# Patient Record
Sex: Male | Born: 1975 | Race: White | Hispanic: No | Marital: Single | Smoking: Never smoker
Health system: Southern US, Community
[De-identification: ages and names within clinical notes are randomized; demographics above are authoritative.]

## PROBLEM LIST (undated history)

## (undated) DIAGNOSIS — F32A Depression, unspecified: Secondary | ICD-10-CM

## (undated) DIAGNOSIS — F419 Anxiety disorder, unspecified: Secondary | ICD-10-CM

## (undated) DIAGNOSIS — F329 Major depressive disorder, single episode, unspecified: Secondary | ICD-10-CM

---

## 2007-02-03 ENCOUNTER — Ambulatory Visit: Payer: Self-pay | Admitting: Surgery

## 2007-02-03 ENCOUNTER — Encounter (INDEPENDENT_AMBULATORY_CARE_PROVIDER_SITE_OTHER): Payer: Self-pay | Admitting: Internal Medicine

## 2007-02-03 ENCOUNTER — Inpatient Hospital Stay (HOSPITAL_COMMUNITY): Admission: EM | Admit: 2007-02-03 | Discharge: 2007-02-04 | Payer: Self-pay | Admitting: Emergency Medicine

## 2009-12-21 ENCOUNTER — Emergency Department (HOSPITAL_COMMUNITY): Admission: EM | Admit: 2009-12-21 | Discharge: 2009-12-22 | Payer: Self-pay | Admitting: Emergency Medicine

## 2010-06-21 LAB — URINALYSIS, ROUTINE W REFLEX MICROSCOPIC
Bilirubin Urine: NEGATIVE
Hgb urine dipstick: NEGATIVE
Ketones, ur: NEGATIVE mg/dL
Nitrite: NEGATIVE

## 2010-08-21 NOTE — Discharge Summary (Signed)
NAMESAIVION, Tyler Rose NO.:  [id number]   MEDICAL RECORD NO.:  [id number]          PATIENT TYPE:  INP   LOCATION:  3025                         FACILITY:  [facility]   PHYSICIAN:  Altha Harm, MDDATE OF BIRTH:  09/08/1975   DATE OF ADMISSION:  02/03/2007  DATE OF DISCHARGE:  02/04/2007                               DISCHARGE SUMMARY   DISCHARGE DIAGNOSES:  Home.   FINAL DISCHARGE DIAGNOSES:  1. Isolated left upper extremity weakness, now resolved.  2. Hyperlipidemia.  3. Mild peripheral arterial disease.   DISCHARGE MEDICATIONS:  Aspirin enteric coated 81 mg p.o. daily.   CONSULTATIONS:  Stroke team.   PROCEDURE:  None.   DIAGNOSTIC STUDIES:  1. CT of the head which showed no acute intracranial abnormalities.  2. MRI of the brain which showed no evidence of ischemic changes.  3. MRA of the intracerebral blood vessels which show no stenosis or      blockages.  4. Carotid Duplex ultrasound which showed 40 to 60% left internal      carotid artery stenosis.   CODE STATUS:  Full code.   ALLERGIES:  NO KNOWN DRUG ALLERGIES.   DIETARY RESTRICTIONS:  The patient should be on a low cholesterol diet.   CHIEF COMPLAINT:  Left upper extremity weakness.   HISTORY OF PRESENT ILLNESS:  Please see the H&P dictated by Dr. Waymon Amato  for details of the HPI.   HOSPITAL COURSE:  The patient was on his way [place] [employer] [place]  was just discharged from the [employer] to [place] on the bus where he was  noted by himself to have weakness of the left upper extremity.  The  patient exited the bus and came to the emergency room to be evaluated.  By the time the patient got to the emergency room his symptoms were  essentially resolved.  The stroke team was consulted while he was in the  emergency room; however, no intervention was mediated at that time due  to the fact that the symptoms were quickly resolving.  The patient had  several studies done as noted above and  all studies showed no evidence  of ischemic changes.  During the course of his 24 hours here the  patient's symptoms resolved completely and the patient who is left-  handed is now able to perform all ADLs with his left hand including  handwriting with normal legibility.  Risk factors were assessed and the  patient was found to have mild hyperlipidemia with an LDL of 134 and  total cholesterol of 187.  Interventions were discussed with the patient  and the patient wants to try dietary intervention for at least 6 weeks.   FOLLOWUP:  The patient will pursue followup with a physician in [place]  upon his return there.   Dietary restrictions are as noted above.  Physical restrictions are  none.      Altha Harm, MD  Electronically Signed     MAM/MEDQ  D:  02/04/2007  T:  02/04/2007  Job:  [id number]

## 2010-08-21 NOTE — H&P (Signed)
Tyler Kitchen  Rose, Tyler Rose NO.:  [id number]   MEDICAL RECORD NO.:  [id number]          PATIENT TYPE:  EMS   LOCATION:  MAJO                         FACILITY:  [facility]   PHYSICIAN:  Marcellus Scott, MD     DATE OF BIRTH:  [birth year]   DATE OF ADMISSION:  02/03/2007  DATE OF DISCHARGE:                              HISTORY & PHYSICAL   PRIMARY MEDICAL DOCTOR:  Unassigned.   CHIEF COMPLAINT:  1. Left upper extremity tingling, numbness and weakness.  2. Chest discomfort and palpitations.   HISTORY OF PRESENT ILLNESS:  Mr. Lanese is a pleasant 35 year old  Caucasian male patient with no past medical history.  He was traveling  from [place], [place] to [place], [place] by [employer].  He arrived from  [place], [place] after a 5 hour ride on the [place] bus to  [place] at about 11:15 p.m.  The patient was sitting on a chair at  the [employer] bus station.  At approximately 12:30 a.m., the patient  noticed subacute onset of tingling of his left upper extremity with  associated numbness followed by weakness of the whole extremity.  The  patient is left handed.  He had to lift his left hand with his right  hand.  There was no associated headache or twisting of the mouth or  slurred speech or loss of consciousness.  He indicated some twitching of  his left side of the face but no tingling or numbness.  There was no  symptoms in the left lower extremity.  Following this, he also noted  some chest discomfort/precordial chest pressure with palpitations.  However, the palpitations and chest pressure resolved spontaneously over  the next few minutes, and the left upper extremity tingling and numbness  and weakness improved over the next 10 minutes.  The patient walked out  over the bus station, and a police officer brought him to the emergency  room at [facility].  At the current time, the patient says his  left upper extremity tingling and numbness have  resolved.  His chest  pressure and palpitations have resolved.  His strength has improved by  85% in the left upper extremity.  However, he indicates that he still  has some sensation of weakness in the left upper extremity.  He is being  admitted for further evaluation and management.  The patient has not had  similar episodes in the past. There is no history of holding his left  upper limb in an abnormal position.   PAST MEDICAL HISTORY:  None.   PAST SURGICAL HISTORY:  None.   PAST PSYCHIATRIC HISTORY:  None.   ALLERGIES:  No known drug allergies.   MEDICATIONS:  None.   FAMILY HISTORY:  Noncontributory.   SOCIAL HISTORY:  The patient is single.  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  He has just finished an assignment in the [employer] three weeks  ago.  He denies any history of smoking, alcohol or drug abuse.   REVIEW OF SYSTEMS:  Fourteen systems reviewed and apart from the history  of  present illness is noncontributory.   PHYSICAL EXAMINATION:  GENERAL:  Mr. Mulkern is a moderately built and  nourished young male in no obvious distress.  VITAL SIGNS:  Temperature 98.1, blood pressure 137/76, pulse 74 and  regular, respirations 19 per minute, saturation 99% on room air.  HEENT:  Normocephalic, atraumatic.  Pupils equal, round and reactive to  light and accommodation.  NECK:  Without JVD, carotid bruit, lymphadenopathy or goiter.  Supple.  LYMPHATICS:  No lymphadenopathy.  RESPIRATORY:  Clear to auscultation.  CARDIOVASCULAR:  First and second heart sounds are heard.  No third or  fourth heart sounds.  No murmurs, rubs, gallops or clicks.  ABDOMEN:  Nondistended, nontender.  No organomegaly or mass appreciated.  Bowel sounds are normally heard.  CENTRAL NERVOUS SYSTEM:  The patient is awake, alert, oriented x3 with  no cranial nerve deficits.  EXTREMITIES:  No cyanosis, clubbing or edema.  Peripheral pulses are  symmetrically felt.  Normal tone throughout.  Left upper  extremity  proximally is grade 5/5 power, distally in the fingers and hand 4+ to  5/5 power, symmetrical 2+ reflexes throughout and flexor plantars.  No  sensory deficits.   LABORATORY DATA:  Point of care cardiac markers and D. dimer negative.  CBCs are normal.  Basic metabolic panel on I-stat normal with creatinine  of 1 and BUN of 11.   CT of the head without contrast.  Impression:  No acute intracranial  abnormality.  Chest x-ray, which I have reviewed and discussed with the  radiologist and noted as impression of no active cardiopulmonary  disease.   EKG with normal sinus rhythm at 66 beats per minute with right bundle  branch block pattern.  No other acute changes.   ASSESSMENT/PLAN:  1. Left upper extremity weakness, tingling, numbness in a young fit      male with no past medical history.  Rule out TIA versus CVA.  Will      admit to telemetry for 23 hour observation.  Will obtain an MRI and      MRA of the head without contrast stat.  Will also obtain an      echocardiogram, carotid Dopplers and a stroke panel.  Will place      the patient on aspirin which can be discontinued if workup is      negative.  Will also obtain urine toxicology screen.  2. Chest discomfort and palpitations, unclear etiology.  Will monitor      on telemetry.  Will cycle cardiac enzymes and obtain a TSH and      echocardiogram.  Will continue aspirin.      Marcellus Scott, MD  Electronically Signed     AH/MEDQ  D:  02/03/2007  T:  02/03/2007  Job:  [id number]

## 2011-01-16 LAB — COMPREHENSIVE METABOLIC PANEL
ALT: 23
AST: 21
Albumin: 3.5
GFR calc Af Amer: >60
GFR calc non Af Amer: >60
Sodium: 139
Total Protein: 6.5

## 2011-01-16 LAB — DIFFERENTIAL
Basophils Absolute: 0.1
Eosinophils Relative: 2
Lymphs Abs: 2.6
Monocytes Absolute: 0.8 — ABNORMAL HIGH
Monocytes Relative: 10
Neutrophils Relative %: 54

## 2011-01-16 LAB — I-STAT 8, (EC8 V) (CONVERTED LAB)
Bicarbonate: 25.9 — ABNORMAL HIGH
Chloride: 105
Operator id: 257131
pH, Ven: 7.365 — ABNORMAL HIGH

## 2011-01-16 LAB — RAPID URINE DRUG SCREEN, HOSP PERFORMED
Amphetamines: NOT DETECTED
Barbiturates: NOT DETECTED
Benzodiazepines: NOT DETECTED
Cocaine: NOT DETECTED
Tetrahydrocannabinol: NOT DETECTED

## 2011-01-16 LAB — LIPID PANEL
HDL: 35 — ABNORMAL LOW
LDL Cholesterol: 134 — ABNORMAL HIGH
Triglycerides: 88

## 2011-01-16 LAB — POCT CARDIAC MARKERS
Operator id: 257131
Troponin i, poc: <0.05

## 2011-01-16 LAB — CBC
MCHC: 34.8
Platelets: 310

## 2011-01-16 LAB — HOMOCYSTEINE: Homocysteine: 8.6

## 2011-01-16 LAB — TROPONIN I: Troponin I: 0.03

## 2011-01-16 LAB — PROTIME-INR
INR: 0.9
Prothrombin Time: 12.7

## 2011-01-16 LAB — CK TOTAL AND CKMB (NOT AT ARMC): Total CK: 240 — ABNORMAL HIGH

## 2016-07-18 ENCOUNTER — Emergency Department (HOSPITAL_COMMUNITY)
Admission: EM | Admit: 2016-07-18 | Discharge: 2016-07-19 | Disposition: A | Discharge status: Home / Self Care | Payer: Self-pay | Attending: Emergency Medicine | Admitting: Emergency Medicine

## 2016-07-18 ENCOUNTER — Encounter (HOSPITAL_COMMUNITY): Payer: Self-pay | Admitting: Vascular Surgery

## 2016-07-18 ENCOUNTER — Emergency Department (HOSPITAL_COMMUNITY): Payer: Self-pay

## 2016-07-18 DIAGNOSIS — Y9289 Other specified places as the place of occurrence of the external cause: Secondary | ICD-10-CM | POA: Insufficient documentation

## 2016-07-18 DIAGNOSIS — Y939 Activity, unspecified: Secondary | ICD-10-CM | POA: Insufficient documentation

## 2016-07-18 DIAGNOSIS — S93402A Sprain of unspecified ligament of left ankle, initial encounter: Secondary | ICD-10-CM | POA: Insufficient documentation

## 2016-07-18 DIAGNOSIS — Y999 Unspecified external cause status: Secondary | ICD-10-CM | POA: Insufficient documentation

## 2016-07-18 HISTORY — DX: Anxiety disorder, unspecified: F41.9

## 2016-07-18 HISTORY — DX: Depression, unspecified: F32.A

## 2016-07-18 HISTORY — DX: Major depressive disorder, single episode, unspecified: F32.9

## 2016-07-18 MED ORDER — DICLOFENAC SODIUM 50 MG PO TBEC: 50.0000 mg | DELAYED_RELEASE_TABLET | Freq: Two times a day (BID) | ORAL | 0 refills | Status: AC

## 2016-07-18 NOTE — ED Provider Notes (Signed)
[facility]EMERGENCY DEPT Provider Note   CSN: [id number] Arrival date & time: 07/18/16  2155   By signing my name below, I, Clarisse Gouge, attest that this documentation has been prepared under the direction and in the presence of [facility], FNP. Electronically Signed: Clarisse Gouge, Scribe. 07/18/16. 11:19 PM.   History   Chief Complaint Chief Complaint  Patient presents with  . Ankle Pain   The history is provided by the patient and medical records. No language interpreter was used.    Tyler Rose is a 41 y.o. male with no pertinent PMHx, transported via private vehicle to the Emergency Department with concern for gradually worsening R ankle pain s/p sustaining an injury to it ~2 hours prior to evaluation. He states the ankle got jammed between the ramp and platform of an [employer] train as he was exiting it. Moderate swelling noted to the L ankle. Pt describes 7/10, constant, throbbing, R ankle pain that is improved with rest and tylenol; worsened with weight bearing, contact and movement. Pt with mild painful ambulation. Pt denies fall, head trauma, LOC, Hx of DM, HTN or any other complaints at this time.  Past Medical History:  Diagnosis Date  . Anxiety   . Depression     There are no active problems to display for this patient.   History reviewed. No pertinent surgical history.     Home Medications    Prior to Admission medications   Medication Sig Start Date End Date Taking? Authorizing Provider  diclofenac (VOLTAREN) 50 MG EC tablet Take 1 tablet (50 mg total) by mouth 2 (two) times daily. 07/18/16   Daevon Holdren Orlene Och, NP    Family History No family history on file.  Social History Social History  Substance Use Topics  . Smoking status: Never Smoker  . Smokeless tobacco: Never Used  . Alcohol use No     Allergies   Patient has no allergy information on record.   Review of Systems Review of Systems  Constitutional: Negative for activity change.    Respiratory: Negative for shortness of breath.   Gastrointestinal: Negative for nausea.  Musculoskeletal: Positive for arthralgias and joint swelling.       Ankle pain  Skin: Negative for color change and wound.  Neurological: Negative for weakness and numbness.     Physical Exam Updated Vital Signs BP 132/84 (BP Location: Left Arm)   Pulse (!) 112   Temp 97.9 F (36.6 C) (Oral)   Resp 18   SpO2 96%   Physical Exam  Constitutional: He is oriented to person, place, and time. He appears well-developed and well-nourished.  HENT:  Head: Normocephalic and atraumatic.  Eyes: EOM are normal.  Neck: Neck supple.  Cardiovascular: Tachycardia present.   Pulmonary/Chest: Effort normal. No stridor.  Musculoskeletal: He exhibits tenderness. He exhibits no deformity.       Left ankle: He exhibits swelling. He exhibits no ecchymosis, no deformity, no laceration and normal pulse. Decreased range of motion: due to pain. Tenderness. Medial malleolus tenderness found. Achilles tendon normal.  Pedal pulses 2+, adequate circulation, no tenderness over the achilles, no defect, tenderness noted to the lateral aspect of the L ankle  Neurological: He is alert and oriented to person, place, and time. Coordination normal.  Skin: Skin is warm and dry.  Psychiatric: He has a normal mood and affect. His behavior is normal.  Nursing note and vitals reviewed.    ED Treatments / Results  DIAGNOSTIC STUDIES: Oxygen Saturation is 96% on  RA, NL by my interpretation.    COORDINATION OF CARE: 11:14 PM Discussed treatment plan with pt at bedside and pt agreed to plan. Will place in ankle stirrup brace, crutches, order medications and refer to orthopedic specialist.  Labs (all labs ordered are listed, but only abnormal results are displayed) Labs Reviewed - No data to display   Radiology Dg Ankle Complete Left  Result Date: 07/18/2016 CLINICAL DATA:  Left ankle pain after injury EXAM: LEFT ANKLE COMPLETE  - 3+ VIEW COMPARISON:  None. FINDINGS: No fracture or dislocation of the left ankle. There is mild circumferential soft tissue swelling. The visualized hindfoot is unremarkable. There is a small plantar calcaneal enthesophyte. No ankle effusion. IMPRESSION: No fracture or dislocation of the left ankle. Electronically Signed   By: Deatra Robinson M.D.   On: 07/18/2016 22:46    Procedures Procedures (including critical care time) Air cast applied by ortho tec. No focal neuro deficits.   Medications Ordered in ED Medications - No data to display   Initial Impression / Assessment and Plan / ED Course  I have reviewed the triage vital signs and the nursing notes.  Pertinent imaging results that were available during my care of the patient were reviewed by me and considered in my medical decision making (see chart for details).   Final Clinical Impressions(s) / ED Diagnoses  41 y.o. male with left ankle pain s/p injury stable for d/c without focal neur deficits. Placed in air cast, crutches, ice, elevation and f/u with ortho if symptoms persist. X-ray negative for fracture or dislocation. Will treat for pain and inflammation.   Final diagnoses:  Sprain of left ankle, unspecified ligament, initial encounter    New Prescriptions New Prescriptions   DICLOFENAC (VOLTAREN) 50 MG EC TABLET    Take 1 tablet (50 mg total) by mouth 2 (two) times daily.  I personally performed the services described in this documentation, which was scribed in my presence. The recorded information has been reviewed and is accurate.    [place], [place] 07/18/16 2344    Arby Barrette, MD 07/22/16 1055

## 2016-07-18 NOTE — ED Notes (Signed)
Pt stable, understands discharge instructions, and reasons for return.   

## 2016-07-18 NOTE — ED Triage Notes (Signed)
Pt reports to the ED for eval of left ankle pain following an injury that occurred just PTA. He states that he got off the [employer] train and his left ankle got stuck in between the ramp and platform. Denies any fall, head injury, or LOC. He is able to bear weight but states that it is painful.

## 2016-07-19 NOTE — Progress Notes (Signed)
Orthopedic Tech Progress Note Patient Details:  Tyler Rose Dec 06, 1975 [id number]  Ortho Devices Type of Ortho Device: Ankle Air splint, Crutches Ortho Device/Splint Location: lle Ortho Device/Splint Interventions: Ordered, Application   Trinna Post 07/19/2016, 12:41 AM

## 2018-08-29 IMAGING — CR DG ANKLE COMPLETE 3+V*L*
3 series · 3 of 3 positions shown · non-contrast
Comparison: None.

CLINICAL DATA: Left ankle pain after injury

EXAM:
LEFT ANKLE COMPLETE - 3+ VIEW

[ankle ap]
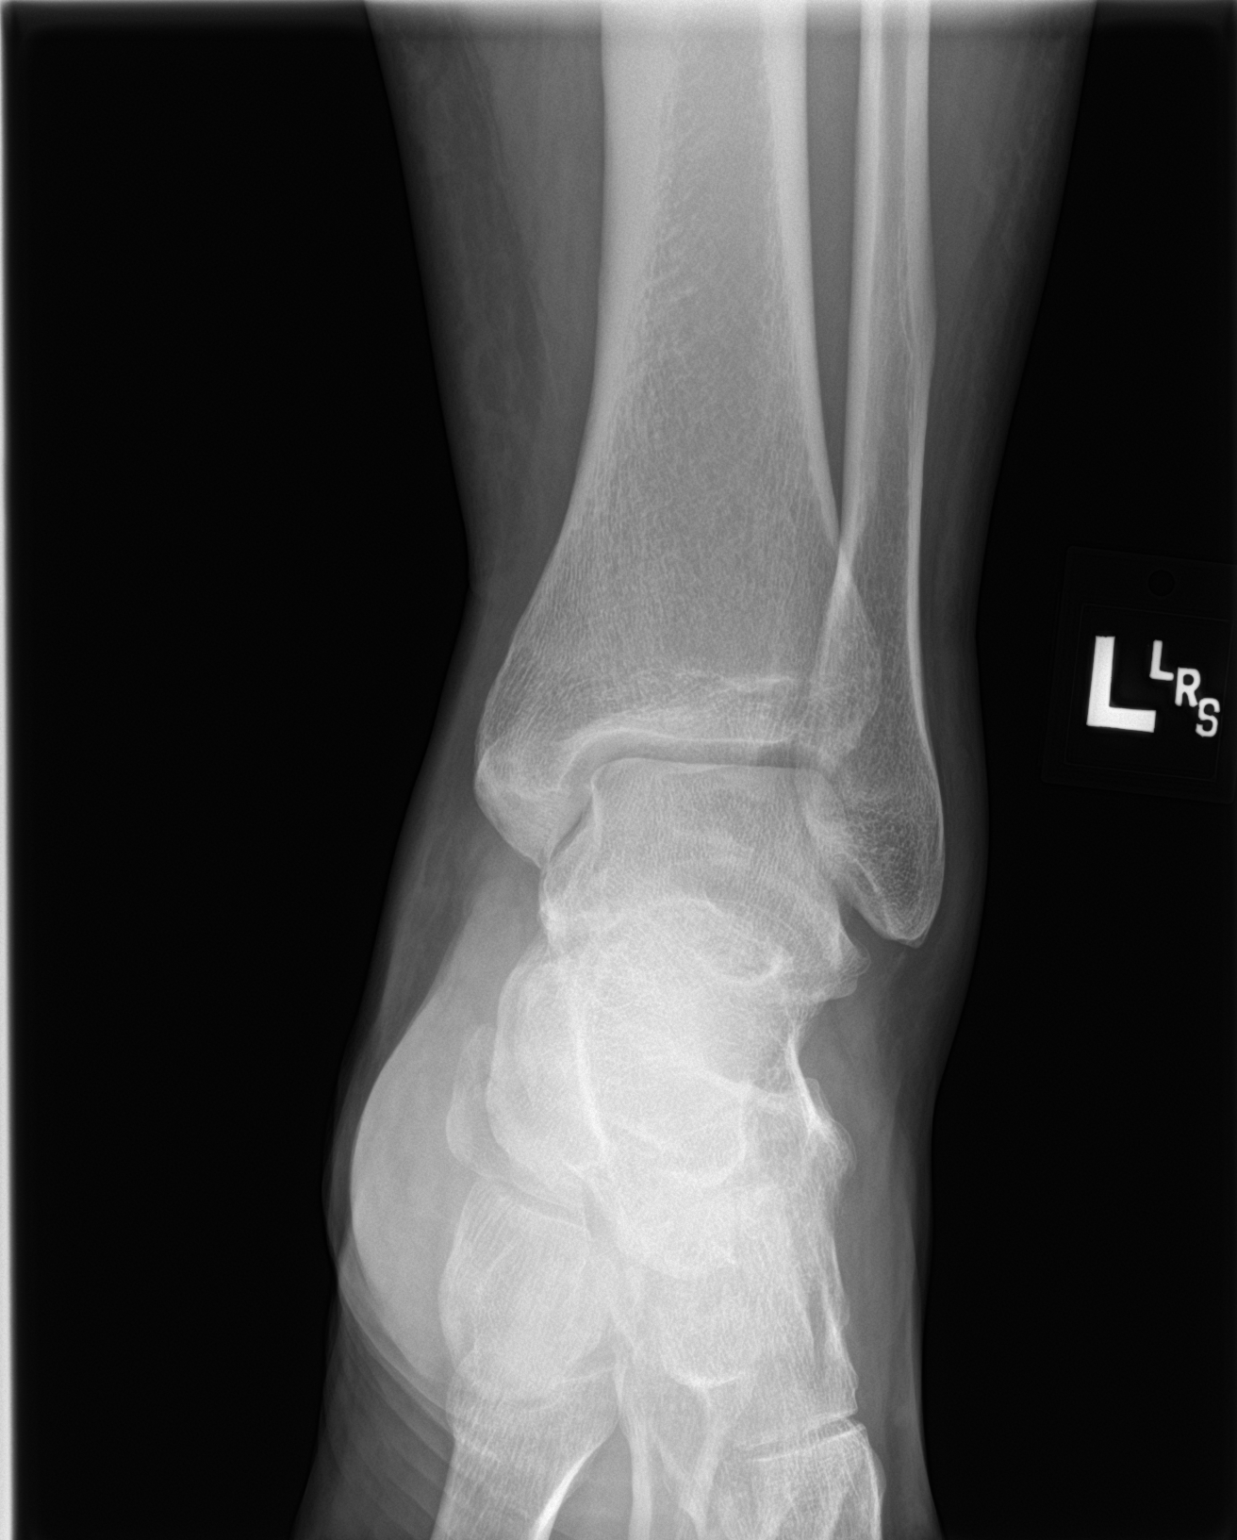

[ankle obl]
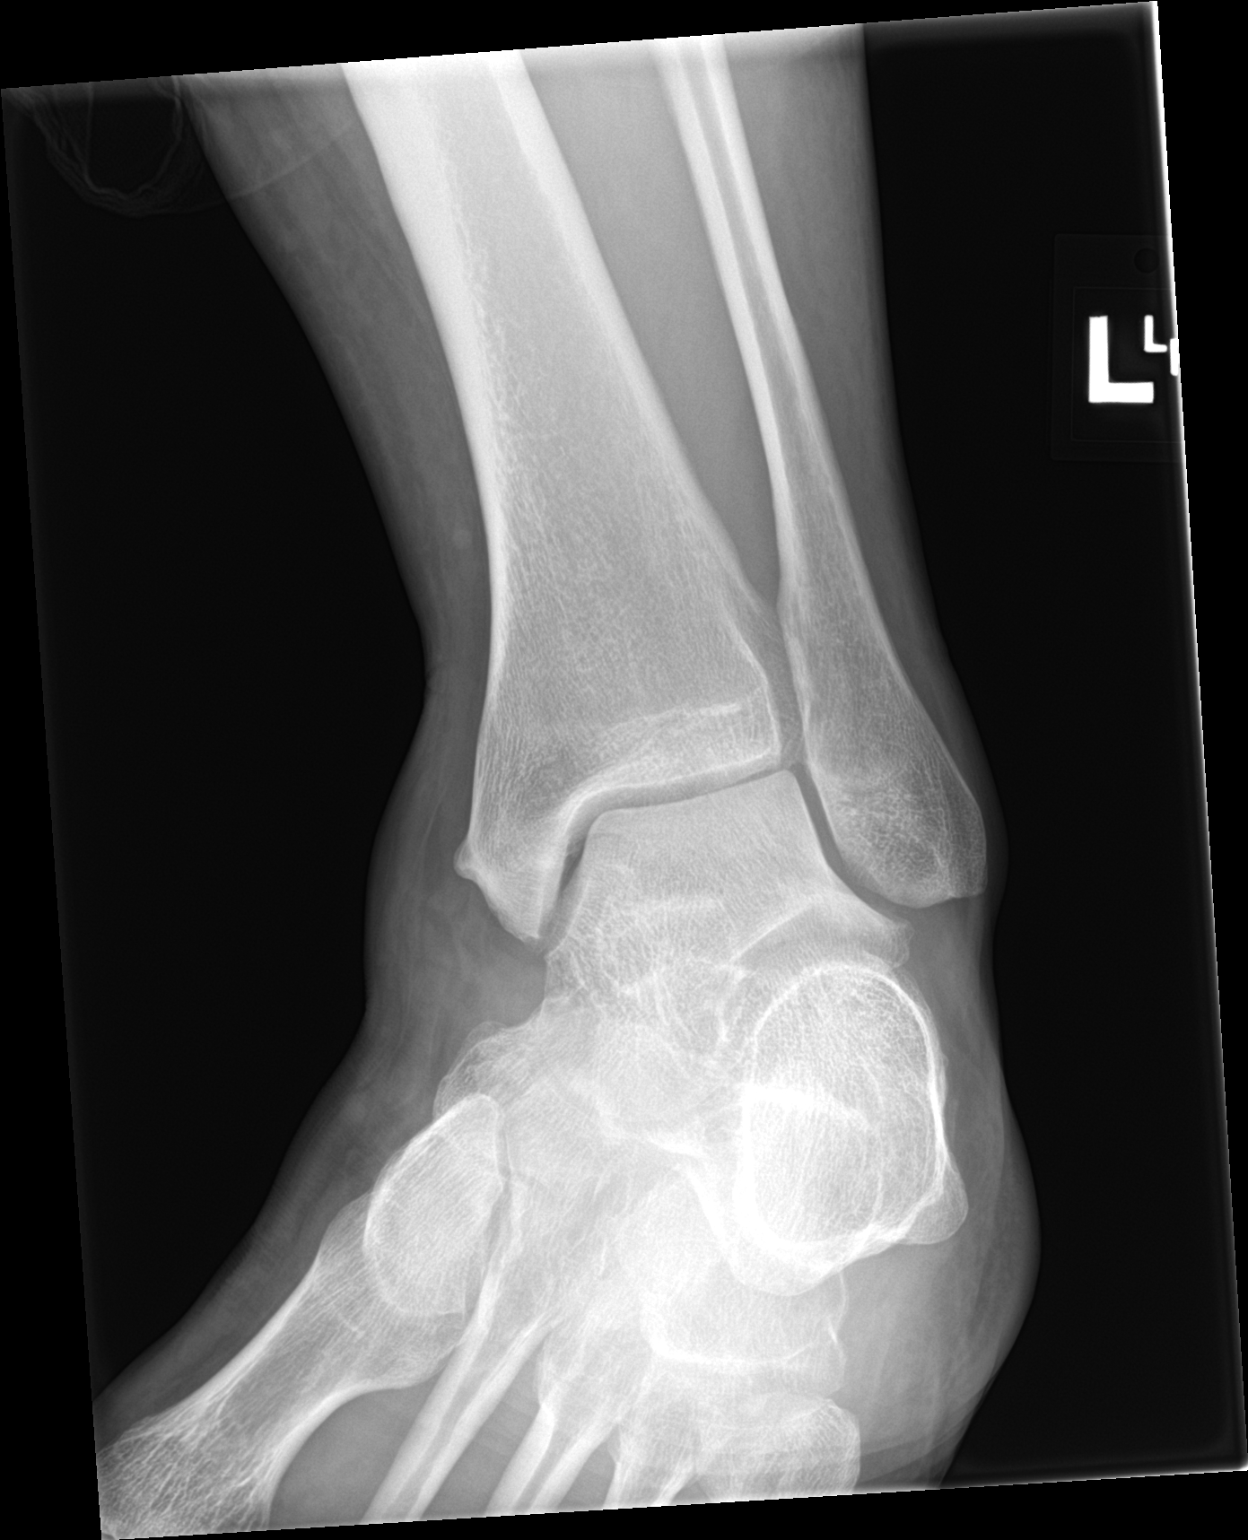

[ankle lat]
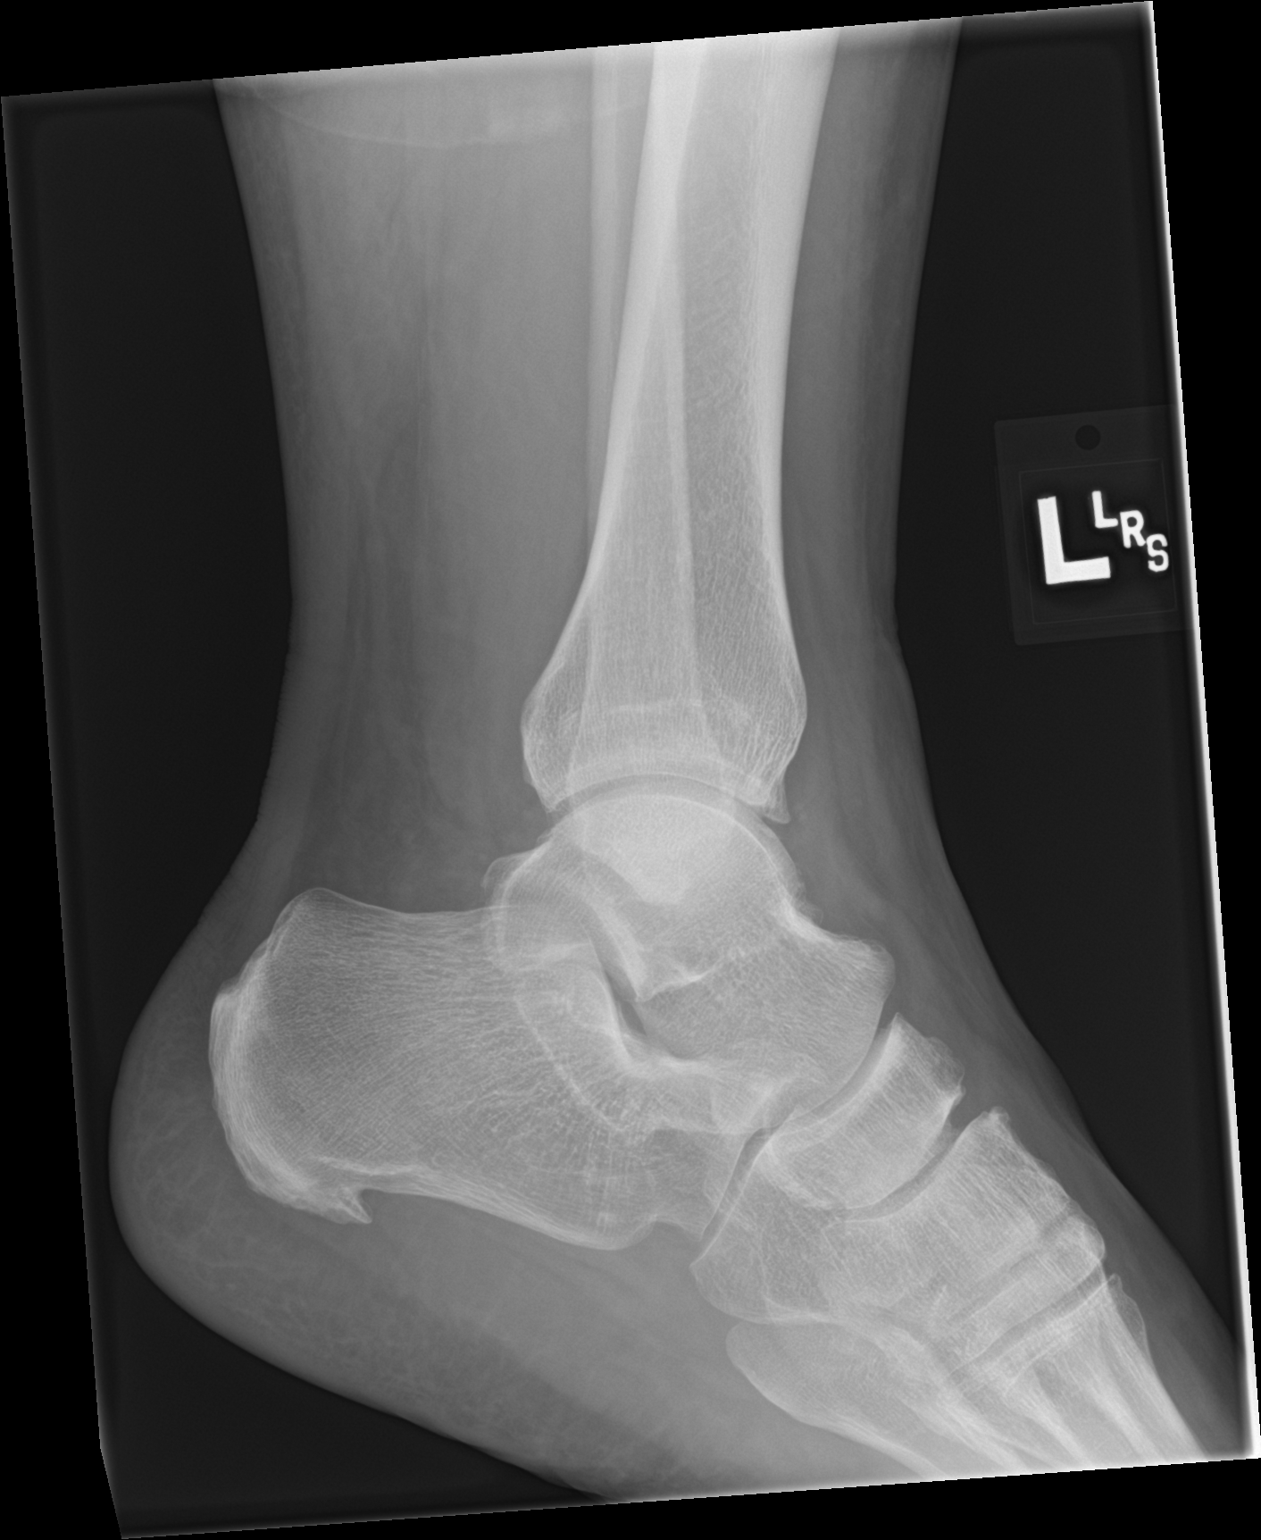

[3 of 3 positions shown; findings below may reference images not displayed]

FINDINGS: No fracture or dislocation of the left ankle. There is mild
circumferential soft tissue swelling. The visualized hindfoot is
unremarkable. There is a small plantar calcaneal enthesophyte. No
ankle effusion.
IMPRESSION: No fracture or dislocation of the left ankle.
